# Patient Record
Sex: Female | Born: 1980 | Race: White | Hispanic: No | Marital: Married | Smoking: Former smoker
Health system: Southern US, Community
[De-identification: ages and names within clinical notes are randomized; demographics above are authoritative.]

## PROBLEM LIST (undated history)

## (undated) DIAGNOSIS — R519 Headache, unspecified: Secondary | ICD-10-CM

## (undated) DIAGNOSIS — Z8489 Family history of other specified conditions: Secondary | ICD-10-CM

## (undated) DIAGNOSIS — I340 Nonrheumatic mitral (valve) insufficiency: Secondary | ICD-10-CM

## (undated) DIAGNOSIS — F4310 Post-traumatic stress disorder, unspecified: Secondary | ICD-10-CM

## (undated) DIAGNOSIS — J302 Other seasonal allergic rhinitis: Secondary | ICD-10-CM

## (undated) HISTORY — PX: WISDOM TOOTH EXTRACTION: SHX21

## (undated) HISTORY — DX: Post-traumatic stress disorder, unspecified: F43.10

## (undated) HISTORY — PX: COLONOSCOPY: SHX174

## (undated) HISTORY — DX: Headache, unspecified: R51.9

---

## 2006-04-20 HISTORY — PX: DIAGNOSTIC LAPAROSCOPY: SUR761

## 2017-12-03 NOTE — Patient Instructions (Addendum)
Your procedure is scheduled on:  Wednesday, Jan 23  [facility] at: 7:15 am  Pick up the phone at the desk and dial [phone].  Call this number if you have problems the morning of surgery: [phone].  Remember: Do NOT eat or Do NOT drink clear liquids (including water) after midnight Tuesday  Take these medicines the morning of surgery with a SIP OF WATER:  None  Stop herbal medications and supplements 1 week prior to surgery.  Do NOT wear jewelry (body piercing), metal hair clips/bobby pins, make-up, or nail polish. Do NOT wear lotions, powders, or perfumes.  You may wear deoderant. Do NOT shave for 48 hours prior to surgery. Do NOT bring valuables to the hospital.  Leave suitcase in car.  After surgery it may be brought to your room.  For patients admitted to the hospital, checkout time is 11:00 AM the day of discharge. Have a responsible adult drive you home and stay with you for 24 hours after your procedure.  Home with husband Tina Kramer cell [phone].

## 2017-12-07 ENCOUNTER — Other Ambulatory Visit: Payer: Self-pay

## 2017-12-07 ENCOUNTER — Encounter (HOSPITAL_COMMUNITY): Payer: Self-pay | Admitting: *Deleted

## 2017-12-07 ENCOUNTER — Encounter (HOSPITAL_COMMUNITY)
Admission: RE | Admit: 2017-12-07 | Discharge: 2017-12-07 | Disposition: A | Discharge status: Home / Self Care | Payer: 59 | Source: Ambulatory Visit | Attending: Obstetrics and Gynecology | Admitting: Obstetrics and Gynecology

## 2017-12-07 DIAGNOSIS — Z01812 Encounter for preprocedural laboratory examination: Secondary | ICD-10-CM | POA: Diagnosis not present

## 2017-12-07 HISTORY — DX: Other seasonal allergic rhinitis: J30.2

## 2017-12-07 HISTORY — DX: Nonrheumatic mitral (valve) insufficiency: I34.0

## 2017-12-07 LAB — CBC
HEMATOCRIT: 40.2 % (ref 36.0–46.0)
HEMOGLOBIN: 13.3 g/dL (ref 12.0–15.0)
MCH: 32 pg (ref 26.0–34.0)
MCHC: 33.1 g/dL (ref 30.0–36.0)
MCV: 96.6 fL (ref 78.0–100.0)
Platelets: 252 10*3/uL (ref 150–400)
RBC: 4.16 MIL/uL (ref 3.87–5.11)
RDW: 12.3 % (ref 11.5–15.5)
WBC: 3.4 10*3/uL — AB (ref 4.0–10.5)

## 2017-12-11 NOTE — H&P (Signed)
Tina Kramer is an 37 y.o. female. Began with irregular menses last January, with cycles beginning every 18-24 days, moderate cramps, burning pain, heavy X 2 days-using "alot of tampons", up at night several times to change, Flow lasts 7 days. Hx of endometriosis with Surgery 02/2006-endometriosis seen behind the uterus per her report. She was unable to take 5 different OCPs to control cycles because of elevated BP and "made me mentally irrational".  Also did not do well with Mirena.  She is ready for definitive surgical therapy.  Pertinent Gynecological History: Last pap: normal Date: 2017 OB History: G1, P1001 SVD   Menstrual History: Patient's last menstrual period was 11/28/2017 (exact date).    Past Medical History:  Diagnosis Date  . Mitral valve regurgitation   . Seasonal allergies   . SVD (spontaneous vaginal delivery)    x 1    Past Surgical History:  Procedure Laterality Date  . COLONOSCOPY    . DIAGNOSTIC LAPAROSCOPY  04/2006   endometriosis  . WISDOM TOOTH EXTRACTION      No family history on file.  Social History:  reports that she quit smoking about 12 years ago. Her smoking use included cigarettes. She quit after 5.00 years of use. she has never used smokeless tobacco. She reports that she does not use drugs. Her alcohol history is not on file.  Allergies:  Allergies  Allergen Reactions  . Sulfa Antibiotics Hives    No medications prior to admission.    Review of Systems  Respiratory: Negative.   Cardiovascular: Negative.     Last menstrual period 11/28/2017. Physical Exam  Constitutional: She appears well-developed and well-nourished.  Cardiovascular: Normal rate, regular rhythm and normal heart sounds.  No murmur heard. Respiratory: Effort normal and breath sounds normal. No respiratory distress. She has no wheezes.  GI: Soft. She exhibits no distension and no mass. There is no tenderness.  Genitourinary: Vagina normal.  Genitourinary  Comments: Uterus normal size, slightly tender No adnexal mass    No results found for this or any previous visit (from the past 24 hour(s)).  No results found.  Assessment/Plan: Menorrhagia and dysmenorrhea, history of endometriosis.  She has not tolerated OCP or Mirena in the past.  All medical and surgical options discussed, she is ready for definitive surgical therapy.  Discussed surgical options and risks, chances of relieving her symptoms.  Will admit for open laparoscopy with LSH/bilateral salpingectomy in a bag, ERAS protocol to reduce pain. Wants to leave cervix for quicker recovery.  Leighton Roachodd D Colbey Wirtanen 12/11/2017, 4:35 PM

## 2017-12-12 ENCOUNTER — Ambulatory Visit (HOSPITAL_COMMUNITY)
Admission: RE | Admit: 2017-12-12 | Discharge: 2017-12-12 | Disposition: A | Discharge status: Home / Self Care | Payer: 59 | Source: Ambulatory Visit | Attending: Obstetrics and Gynecology | Admitting: Obstetrics and Gynecology

## 2017-12-12 ENCOUNTER — Encounter (HOSPITAL_COMMUNITY)
Admission: RE | Disposition: A | Discharge status: Home / Self Care | Payer: Self-pay | Source: Ambulatory Visit | Attending: Obstetrics and Gynecology

## 2017-12-12 ENCOUNTER — Ambulatory Visit (HOSPITAL_COMMUNITY): Payer: 59 | Admitting: Anesthesiology

## 2017-12-12 ENCOUNTER — Encounter (HOSPITAL_COMMUNITY): Payer: Self-pay | Admitting: Emergency Medicine

## 2017-12-12 ENCOUNTER — Other Ambulatory Visit: Payer: Self-pay

## 2017-12-12 DIAGNOSIS — I340 Nonrheumatic mitral (valve) insufficiency: Secondary | ICD-10-CM | POA: Diagnosis not present

## 2017-12-12 DIAGNOSIS — N920 Excessive and frequent menstruation with regular cycle: Secondary | ICD-10-CM | POA: Insufficient documentation

## 2017-12-12 DIAGNOSIS — Z87891 Personal history of nicotine dependence: Secondary | ICD-10-CM | POA: Insufficient documentation

## 2017-12-12 DIAGNOSIS — Z90711 Acquired absence of uterus with remaining cervical stump: Secondary | ICD-10-CM | POA: Diagnosis present

## 2017-12-12 DIAGNOSIS — N946 Dysmenorrhea, unspecified: Secondary | ICD-10-CM | POA: Diagnosis not present

## 2017-12-12 DIAGNOSIS — N800 Endometriosis of uterus: Secondary | ICD-10-CM | POA: Insufficient documentation

## 2017-12-12 HISTORY — DX: Family history of other specified conditions: Z84.89

## 2017-12-12 HISTORY — PX: LAPAROSCOPIC SUPRACERVICAL HYSTERECTOMY: SHX5399

## 2017-12-12 HISTORY — PX: BILATERAL SALPINGECTOMY: SHX5743

## 2017-12-12 LAB — PREGNANCY, URINE: PREG TEST UR: NEGATIVE

## 2017-12-12 SURGERY — HYSTERECTOMY, SUPRACERVICAL, LAPAROSCOPIC
Anesthesia: General

## 2017-12-12 MED ORDER — GABAPENTIN 300 MG PO CAPS
ORAL_CAPSULE | ORAL | Status: AC
  Administered 2017-12-12: 300 mg via ORAL
  Filled 2017-12-12: qty 1

## 2017-12-12 MED ORDER — SUGAMMADEX SODIUM 200 MG/2ML IV SOLN
INTRAVENOUS | Status: AC
  Filled 2017-12-12: qty 2

## 2017-12-12 MED ORDER — KETOROLAC TROMETHAMINE 30 MG/ML IJ SOLN: 30.0000 mg | Freq: Four times a day (QID) | INTRAMUSCULAR | Status: DC

## 2017-12-12 MED ORDER — OXYCODONE HCL 5 MG PO TABS: 5.0000 mg | ORAL_TABLET | ORAL | 0 refills | Status: DC | PRN

## 2017-12-12 MED ORDER — SODIUM CHLORIDE 0.9 % IJ SOLN
INTRAMUSCULAR | Status: AC
  Filled 2017-12-12: qty 10

## 2017-12-12 MED ORDER — DEXAMETHASONE SODIUM PHOSPHATE 4 MG/ML IJ SOLN
INTRAMUSCULAR | Status: DC | PRN
  Administered 2017-12-12: 4 mg via INTRAVENOUS

## 2017-12-12 MED ORDER — SCOPOLAMINE 1 MG/3DAYS TD PT72
MEDICATED_PATCH | TRANSDERMAL | Status: AC
  Administered 2017-12-12: 1.5 mg via TRANSDERMAL
  Filled 2017-12-12: qty 1

## 2017-12-12 MED ORDER — METOCLOPRAMIDE HCL 5 MG/ML IJ SOLN: 10.0000 mg | Freq: Once | INTRAMUSCULAR | Status: DC | PRN

## 2017-12-12 MED ORDER — PROPOFOL 10 MG/ML IV BOLUS
INTRAVENOUS | Status: DC | PRN
  Administered 2017-12-12: 200 mg via INTRAVENOUS

## 2017-12-12 MED ORDER — LACTATED RINGERS IV SOLN: INTRAVENOUS | Status: DC

## 2017-12-12 MED ORDER — KETOROLAC TROMETHAMINE 30 MG/ML IJ SOLN
30.0000 mg | Freq: Four times a day (QID) | INTRAMUSCULAR | Status: DC
  Administered 2017-12-12: 30 mg via INTRAVENOUS
  Filled 2017-12-12: qty 1

## 2017-12-12 MED ORDER — SOD CITRATE-CITRIC ACID 500-334 MG/5ML PO SOLN
30.0000 mL | Freq: Once | ORAL | Status: AC
  Administered 2017-12-12: 30 mL via ORAL

## 2017-12-12 MED ORDER — DEXAMETHASONE SODIUM PHOSPHATE 10 MG/ML IJ SOLN
INTRAMUSCULAR | Status: AC
  Filled 2017-12-12: qty 1

## 2017-12-12 MED ORDER — ROCURONIUM BROMIDE 100 MG/10ML IV SOLN
INTRAVENOUS | Status: DC | PRN
  Administered 2017-12-12: 10 mg via INTRAVENOUS
  Administered 2017-12-12: 50 mg via INTRAVENOUS

## 2017-12-12 MED ORDER — SUGAMMADEX SODIUM 200 MG/2ML IV SOLN
INTRAVENOUS | Status: DC | PRN
  Administered 2017-12-12: 100 mg via INTRAVENOUS

## 2017-12-12 MED ORDER — ONDANSETRON HCL 4 MG PO TABS: 4.0000 mg | ORAL_TABLET | Freq: Four times a day (QID) | ORAL | Status: DC | PRN

## 2017-12-12 MED ORDER — IBUPROFEN 600 MG PO TABS: 600.0000 mg | ORAL_TABLET | Freq: Four times a day (QID) | ORAL | Status: DC | PRN

## 2017-12-12 MED ORDER — ALUM & MAG HYDROXIDE-SIMETH 200-200-20 MG/5ML PO SUSP: 30.0000 mL | ORAL | Status: DC | PRN

## 2017-12-12 MED ORDER — SOD CITRATE-CITRIC ACID 500-334 MG/5ML PO SOLN
ORAL | Status: AC
  Filled 2017-12-12: qty 15

## 2017-12-12 MED ORDER — HYDROMORPHONE HCL 1 MG/ML IJ SOLN
0.2500 mg | INTRAMUSCULAR | Status: DC | PRN
  Administered 2017-12-12 (×2): 0.25 mg via INTRAVENOUS

## 2017-12-12 MED ORDER — ONDANSETRON HCL 4 MG/2ML IJ SOLN: 4.0000 mg | Freq: Four times a day (QID) | INTRAMUSCULAR | Status: DC | PRN

## 2017-12-12 MED ORDER — SCOPOLAMINE 1 MG/3DAYS TD PT72
1.0000 | MEDICATED_PATCH | Freq: Once | TRANSDERMAL | Status: DC
  Administered 2017-12-12: 1.5 mg via TRANSDERMAL

## 2017-12-12 MED ORDER — HYDROMORPHONE HCL 1 MG/ML IJ SOLN: 1.0000 mg | INTRAMUSCULAR | Status: DC | PRN

## 2017-12-12 MED ORDER — ACETAMINOPHEN 500 MG PO TABS
1000.0000 mg | ORAL_TABLET | ORAL | Status: AC
  Administered 2017-12-12: 1000 mg via ORAL

## 2017-12-12 MED ORDER — BUPIVACAINE HCL (PF) 0.25 % IJ SOLN
INTRAMUSCULAR | Status: AC
  Filled 2017-12-12: qty 30

## 2017-12-12 MED ORDER — ONDANSETRON HCL 4 MG/2ML IJ SOLN
INTRAMUSCULAR | Status: DC | PRN
  Administered 2017-12-12: 4 mg via INTRAVENOUS

## 2017-12-12 MED ORDER — SIMETHICONE 80 MG PO CHEW
80.0000 mg | CHEWABLE_TABLET | Freq: Four times a day (QID) | ORAL | Status: DC | PRN
Start: 2017-12-12 — End: 2017-12-12
  Administered 2017-12-12: 80 mg via ORAL
  Filled 2017-12-12: qty 1

## 2017-12-12 MED ORDER — OXYCODONE HCL 5 MG PO TABS
5.0000 mg | ORAL_TABLET | ORAL | Status: DC | PRN
  Administered 2017-12-12: 5 mg via ORAL
  Filled 2017-12-12: qty 1

## 2017-12-12 MED ORDER — MEPERIDINE HCL 25 MG/ML IJ SOLN: 6.2500 mg | INTRAMUSCULAR | Status: DC | PRN

## 2017-12-12 MED ORDER — IBUPROFEN 600 MG PO TABS: 600.0000 mg | ORAL_TABLET | Freq: Four times a day (QID) | ORAL | 0 refills | Status: DC | PRN

## 2017-12-12 MED ORDER — PROPOFOL 10 MG/ML IV BOLUS
INTRAVENOUS | Status: AC
  Filled 2017-12-12: qty 20

## 2017-12-12 MED ORDER — LACTATED RINGERS IV SOLN
INTRAVENOUS | Status: DC
  Administered 2017-12-12 (×2): via INTRAVENOUS

## 2017-12-12 MED ORDER — CEFAZOLIN SODIUM-DEXTROSE 2-4 GM/100ML-% IV SOLN
2.0000 g | INTRAVENOUS | Status: AC
  Administered 2017-12-12: 2 g via INTRAVENOUS

## 2017-12-12 MED ORDER — LIDOCAINE HCL (CARDIAC) 20 MG/ML IV SOLN
INTRAVENOUS | Status: AC
  Filled 2017-12-12: qty 5

## 2017-12-12 MED ORDER — HYDROMORPHONE HCL 1 MG/ML IJ SOLN
INTRAMUSCULAR | Status: AC
  Administered 2017-12-12: 0.25 mg via INTRAVENOUS
  Filled 2017-12-12: qty 0.5

## 2017-12-12 MED ORDER — ONDANSETRON HCL 4 MG/2ML IJ SOLN
INTRAMUSCULAR | Status: AC
  Filled 2017-12-12: qty 2

## 2017-12-12 MED ORDER — KETOROLAC TROMETHAMINE 30 MG/ML IJ SOLN
INTRAMUSCULAR | Status: DC | PRN
  Administered 2017-12-12: 30 mg via INTRAVENOUS

## 2017-12-12 MED ORDER — DEXTROSE-NACL 5-0.45 % IV SOLN
INTRAVENOUS | Status: DC
  Administered 2017-12-12: 12:00:00 via INTRAVENOUS

## 2017-12-12 MED ORDER — MIDAZOLAM HCL 2 MG/2ML IJ SOLN
INTRAMUSCULAR | Status: AC
  Filled 2017-12-12: qty 2

## 2017-12-12 MED ORDER — FENTANYL CITRATE (PF) 100 MCG/2ML IJ SOLN
INTRAMUSCULAR | Status: DC | PRN
  Administered 2017-12-12: 50 ug via INTRAVENOUS
  Administered 2017-12-12: 100 ug via INTRAVENOUS

## 2017-12-12 MED ORDER — FENTANYL CITRATE (PF) 250 MCG/5ML IJ SOLN
INTRAMUSCULAR | Status: AC
  Filled 2017-12-12: qty 5

## 2017-12-12 MED ORDER — GABAPENTIN 300 MG PO CAPS
300.0000 mg | ORAL_CAPSULE | ORAL | Status: AC
  Administered 2017-12-12: 300 mg via ORAL

## 2017-12-12 MED ORDER — SCOPOLAMINE 1 MG/3DAYS TD PT72: 1.0000 | MEDICATED_PATCH | TRANSDERMAL | Status: DC

## 2017-12-12 MED ORDER — MENTHOL 3 MG MT LOZG: 1.0000 | LOZENGE | OROMUCOSAL | Status: DC | PRN

## 2017-12-12 MED ORDER — GABAPENTIN 300 MG PO CAPS
300.0000 mg | ORAL_CAPSULE | Freq: Three times a day (TID) | ORAL | Status: DC
  Administered 2017-12-12: 300 mg via ORAL
  Filled 2017-12-12 (×3): qty 1

## 2017-12-12 MED ORDER — GABAPENTIN 300 MG PO CAPS: 300.0000 mg | ORAL_CAPSULE | Freq: Three times a day (TID) | ORAL | 0 refills | Status: DC

## 2017-12-12 MED ORDER — MIDAZOLAM HCL 2 MG/2ML IJ SOLN
INTRAMUSCULAR | Status: DC | PRN
  Administered 2017-12-12: 2 mg via INTRAVENOUS

## 2017-12-12 MED ORDER — LIDOCAINE HCL (CARDIAC) 20 MG/ML IV SOLN
INTRAVENOUS | Status: DC | PRN
  Administered 2017-12-12: 50 mg via INTRAVENOUS

## 2017-12-12 MED ORDER — ROCURONIUM BROMIDE 100 MG/10ML IV SOLN
INTRAVENOUS | Status: AC
  Filled 2017-12-12: qty 1

## 2017-12-12 MED ORDER — BUPIVACAINE HCL (PF) 0.25 % IJ SOLN
INTRAMUSCULAR | Status: DC | PRN
  Administered 2017-12-12: 20 mL

## 2017-12-12 SURGICAL SUPPLY — 32 items
BARRIER ADHS 3X4 INTERCEED (GAUZE/BANDAGES/DRESSINGS) ×4 IMPLANT
BLADE LAP MORCELLATOR 15MMX9.5 (ELECTROSURGICAL)
BLADE LAP MORCELLATOR 15X9.5 (ELECTROSURGICAL) IMPLANT
CELL SAVER LIPIGURD (MISCELLANEOUS) ×2 IMPLANT
COVER MAYO STAND STRL (DRAPES) ×4 IMPLANT
DERMABOND ADHESIVE PROPEN (GAUZE/BANDAGES/DRESSINGS) ×2
DERMABOND ADVANCED .7 DNX6 (GAUZE/BANDAGES/DRESSINGS) ×2 IMPLANT
DRSG OPSITE POSTOP 3X4 (GAUZE/BANDAGES/DRESSINGS) ×4 IMPLANT
DURAPREP 26ML APPLICATOR (WOUND CARE) ×4 IMPLANT
EXTRT SYSTEM ALEXIS 14CM (MISCELLANEOUS) ×4
FILTER SMOKE EVAC LAPAROSHD (FILTER) ×4 IMPLANT
GLOVE BIO SURGEON STRL SZ8 (GLOVE) ×4 IMPLANT
GLOVE BIOGEL PI IND STRL 7.0 (GLOVE) ×4 IMPLANT
GLOVE BIOGEL PI INDICATOR 7.0 (GLOVE) ×4
GLOVE ORTHO TXT STRL SZ7.5 (GLOVE) ×4 IMPLANT
GOWN STRL REUS W/TWL LRG LVL3 (GOWN DISPOSABLE) ×8 IMPLANT
NEEDLE INSUFFLATION 120MM (ENDOMECHANICALS) ×4 IMPLANT
PACK LAPAROSCOPY BASIN (CUSTOM PROCEDURE TRAY) ×4 IMPLANT
PACK TRENDGUARD 450 HYBRID PRO (MISCELLANEOUS) ×2 IMPLANT
PROTECTOR NERVE ULNAR (MISCELLANEOUS) ×8 IMPLANT
SET IRRIG TUBING LAPAROSCOPIC (IRRIGATION / IRRIGATOR) ×4 IMPLANT
SHEARS HARMONIC ACE PLUS 36CM (ENDOMECHANICALS) ×4 IMPLANT
SLEEVE XCEL OPT CAN 5 100 (ENDOMECHANICALS) ×4 IMPLANT
SUT VIC AB 3-0 PS2 18 (SUTURE) ×2
SUT VIC AB 3-0 PS2 18XBRD (SUTURE) ×2 IMPLANT
SUT VICRYL 0 TIES 12 18 (SUTURE) ×4 IMPLANT
SUT VICRYL 0 UR6 27IN ABS (SUTURE) ×4 IMPLANT
TOWEL OR 17X24 6PK STRL BLUE (TOWEL DISPOSABLE) ×8 IMPLANT
TRAY FOLEY CATH SILVER 14FR (SET/KITS/TRAYS/PACK) ×4 IMPLANT
TRENDGUARD 450 HYBRID PRO PACK (MISCELLANEOUS) ×4
TROCAR XCEL NON-BLD 5MMX100MML (ENDOMECHANICALS) ×4 IMPLANT
WARMER LAPAROSCOPE (MISCELLANEOUS) ×4 IMPLANT

## 2017-12-12 NOTE — Progress Notes (Signed)
[facility] pain ok, ambulating, voiding, no n/v, wants to go home Afeb, VSS Abd- soft, incisions intact D/c home

## 2017-12-12 NOTE — Anesthesia Postprocedure Evaluation (Signed)
Anesthesia Post Note  Patient: Tina Kramer  Procedure(s) Performed: LAPAROSCOPIC SUPRACERVICAL HYSTERECTOMY (N/A ) BILATERAL SALPINGECTOMY (Bilateral )     Patient location during evaluation: PACU Anesthesia Type: General Level of consciousness: awake and alert and oriented Pain management: pain level controlled Vital Signs Assessment: post-procedure vital signs reviewed and stable Respiratory status: spontaneous breathing, nonlabored ventilation and respiratory function stable Cardiovascular status: blood pressure returned to baseline and stable Postop Assessment: no apparent nausea or vomiting Anesthetic complications: no    Last Vitals:  Vitals:   12/12/17 1115 12/12/17 1130  BP: 118/79 116/77  Pulse: (!) 41 (!) 41  Resp: 16 16  Temp:  36.8 C  SpO2: 100% 100%    Last Pain:  Vitals:   12/12/17 1130  TempSrc:   PainSc: 1    Pain Goal: Patients Stated Pain Goal: 4 (12/12/17 0718)               Rollins Wrightson A.

## 2017-12-12 NOTE — Interval H&P Note (Signed)
History and Physical Interval Note:  12/12/2017 8:06 AM  Tina Kramer  has presented today for surgery, with the diagnosis of menorrhagia, dysmenorrhea  The various methods of treatment have been discussed with the patient and family. After consideration of risks, benefits and other options for treatment, the patient has consented to  Procedure(s): LAPAROSCOPIC SUPRACERVICAL HYSTERECTOMY (N/A) BILATERAL SALPINGECTOMY (Bilateral) as a surgical intervention .  The patient's history has been reviewed, patient examined, no change in status, stable for surgery.  I have reviewed the patient's chart and labs.  Questions were answered to the patient's satisfaction.     Leighton Roachodd D Amethyst Gainer

## 2017-12-12 NOTE — Anesthesia Preprocedure Evaluation (Addendum)
Anesthesia Evaluation  Patient identified by MRN, date of birth, ID band Patient awake    Reviewed: Allergy & Precautions, NPO status , Patient's Chart, lab work & pertinent test results  History of Anesthesia Complications (+) Family history of anesthesia reaction  Airway Mallampati: I  TM Distance: >3 FB Neck ROM: Full    Dental no notable dental hx. (+) Teeth Intact Lower permanent retainer:   Pulmonary former smoker,    Pulmonary exam normal breath sounds clear to auscultation       Cardiovascular negative cardio ROS Normal cardiovascular exam Rhythm:Regular Rate:Normal     Neuro/Psych negative neurological ROS  negative psych ROS   GI/Hepatic negative GI ROS, Neg liver ROS,   Endo/Other  negative endocrine ROS  Renal/GU negative Renal ROS  negative genitourinary   Musculoskeletal negative musculoskeletal ROS (+)   Abdominal   Peds  Hematology negative hematology ROS (+)   Anesthesia Other Findings   Reproductive/Obstetrics Menorrhagia Dysmenorrhea                            Anesthesia Physical Anesthesia Plan  ASA: II  Anesthesia Plan: General   Post-op Pain Management:    Induction: Intravenous  PONV Risk Score and Plan: Ondansetron, Dexamethasone, Scopolamine patch - Pre-op, Midazolam and Treatment may vary due to age or medical condition  Airway Management Planned: Oral ETT  Additional Equipment:   Intra-op Plan:   Post-operative Plan: Extubation in OR  Informed Consent: I have reviewed the patients History and Physical, chart, labs and discussed the procedure including the risks, benefits and alternatives for the proposed anesthesia with the patient or authorized representative who has indicated his/her understanding and acceptance.   Dental advisory given  Plan Discussed with: CRNA, Anesthesiologist and Surgeon  Anesthesia Plan Comments:          Anesthesia Quick Evaluation

## 2017-12-12 NOTE — Transfer of Care (Signed)
Immediate Anesthesia Transfer of Care Note  Patient: Tina Kramer  Procedure(s) Performed: LAPAROSCOPIC SUPRACERVICAL HYSTERECTOMY (N/A ) BILATERAL SALPINGECTOMY (Bilateral )  Patient Location: PACU  Anesthesia Type:General  Level of Consciousness: awake, alert  and oriented  Airway & Oxygen Therapy: Patient Spontanous Breathing and Patient connected to nasal cannula oxygen  Post-op Assessment: Report given to RN and Post -op Vital signs reviewed and stable  Post vital signs: Reviewed and stable  Last Vitals:  Vitals:   12/12/17 0718  BP: 120/86  Pulse: (!) 58  Resp: 16  Temp: 36.6 C  SpO2: 100%    Last Pain:  Vitals:   12/12/17 0718  TempSrc: Oral      Patients Stated Pain Goal: 4 (12/12/17 0718)  Complications: No apparent anesthesia complications

## 2017-12-12 NOTE — Op Note (Signed)
Preoperative diagnosis: Menorrhagia, dysmenorrhea Postoperative diagnosis: Same Procedure: Laparoscopic supracervical hysterectomy, bilateral salpingectomy Surgeon: Lavina Hammanodd Arriel Victor M.D. Assistant: Doristine Counterecelia Banga, D.O. Anesthesia: Gen. Endotracheal tube Findings: She had an essentially normal pelvis  Specimens: Morcellated uterus for routine pathology Estimated blood loss: 100 cc Complications: None  Procedure in detail: The patient was taken to the operating room and placed in the dorsosupine position. General anesthesia was induced. Arms were tucked to her sides and legs were placed in mobile stirrups. Abdomen perineum and vagina were then prepped and draped in usual sterile fashion and a Foley catheter was inserted. Infraumbilical skin was infiltrated with quarter percent Marcaine and a 3 cm horizontal incision was made.  The fascia was elevated and entered sharply with scissors.  Peritoneum was then entered bluntly.  A pursestring suture of 0 Vicryl was then placed around the fascial incision.  The Hassan cannula with a balloon was then inserted and the abdomen was insufflated.  The laparoscope was inserted and confirmed good placement.  A 5 mm port was then placed on each side under direct visualization. Inspection revealed the above-mentioned findings.  The distal right fallopian tube was grasped and elevated.  Harmonic scalpel was used to remove the tube from the ovary and take down the mesosalpinx to the uterus.  The right uterine cornu was grasped with a single-tooth tenaculum from the left side. The Harmonic scalpel Ace was used to take down the right round ligament, utero-ovarian pedicle and broad ligament. The anterior peritoneum was incised across the anterior portion of the uterus to help release the bladder. Uterine artery artery was skeletonized and taken down with the harmonic scalpel Ace with adequate division and adequate hemostasis. A similar procedure was then performed on the patient's  left side taking down the mesosalpinx to free the tube, round ligament, utero-ovarian pedicle, and broad ligament. Anterior peritoneum was incised across the anterior portion the uterus to meet the incision coming from the patient's right side. Uterine artery was skeletonized and taken down with the Harmonic Scalpel with adequate division and adequate hemostasis. I then began to remove the uterus from the cervix using a drill, clamp, cut technique on maximum power, using the Harmonic scalpel Ace. This was done about halfway on the left side and then halfway on the right side removing the uterus from the cervix. The cervical stump appeared to be hemostatic.  The umbilical trocar was removed and an Alexis bag was inserted through the incision into the pelvis.  The umbilical trocar was reintroduced and the abdomen was reinsufflated.  I was then able to position the bag in the pelvis and placed the uterus in the bag.  The string for the bag was grasped through the umbilical trocar and pulled up through that trocar as it was again removed.  The bag was then brought through the incision so that we could morcellate the uterus.  I was unable to place the guard in the bag to protect the abdominal incision because the incision was so small.  However I carefully used a scalpel and was able to remove the uterus in several pieces in the bag.  The bag was removed and the umbilical trocar was replaced.  Pelvis was copiously irrigated. Small amount of bleeding from the cervical stump was controlled with the harmonic scalpel Ace. A piece of Interceed was placed over the cervical stump. At this point all pedicles appeared to be hemostatic and there was no other pathology noted.  The 5 mm ports were removed under direct  visualization all gas was allowed to deflate from the abdomen.  The umbilical trocar was removed.  The previously placed pursestring suture was tied and this achieved good fascial closure.  Skin incisions were closed  with interrupted subcuticular sutures of 4-0 Vicryl followed by Dermabond. The patient tolerated the procedure well. She was taken to the recovery in stable condition. Counts were correct x2, she received Ancef 2 g IV the beginning of the procedure and had PAS hose on throughout the procedure.

## 2017-12-12 NOTE — Progress Notes (Signed)
Discharge teaching complete with pt. Pt understood all information and did not have any questions. Pt given pain med at discharge. Pt discharged home to family.

## 2017-12-12 NOTE — Discharge Instructions (Signed)
Routine instructions for laparoscopic hysterectomy 

## 2017-12-12 NOTE — Anesthesia Procedure Notes (Signed)
Procedure Name: Intubation Date/Time: 12/12/2017 8:51 AM Performed by: Cleda ClarksBrowder, Iliya Spivack R, CRNA Pre-anesthesia Checklist: Patient identified, Emergency Drugs available, Suction available and Patient being monitored Patient Re-evaluated:Patient Re-evaluated prior to induction Oxygen Delivery Method: Circle system utilized Preoxygenation: Pre-oxygenation with 100% oxygen Induction Type: IV induction Ventilation: Mask ventilation without difficulty Laryngoscope Size: Miller and 2 Grade View: Grade I Tube type: Oral Number of attempts: 1 Airway Equipment and Method: Stylet and Oral airway Placement Confirmation: ETT inserted through vocal cords under direct vision,  positive ETCO2 and breath sounds checked- equal and bilateral Secured at: 22 cm Tube secured with: Tape Dental Injury: Teeth and Oropharynx as per pre-operative assessment

## 2017-12-13 ENCOUNTER — Encounter (HOSPITAL_COMMUNITY): Payer: Self-pay | Admitting: Obstetrics and Gynecology

## 2018-12-16 ENCOUNTER — Other Ambulatory Visit: Payer: Self-pay | Admitting: Gynecology

## 2018-12-16 DIAGNOSIS — R928 Other abnormal and inconclusive findings on diagnostic imaging of breast: Secondary | ICD-10-CM

## 2018-12-23 ENCOUNTER — Ambulatory Visit: Payer: 59

## 2018-12-23 ENCOUNTER — Other Ambulatory Visit: Payer: Self-pay | Admitting: Gynecology

## 2018-12-23 ENCOUNTER — Ambulatory Visit
Admission: RE | Admit: 2018-12-23 | Discharge: 2018-12-23 | Disposition: A | Discharge status: Home / Self Care | Payer: 59 | Source: Ambulatory Visit | Attending: Gynecology | Admitting: Gynecology

## 2018-12-23 DIAGNOSIS — N6001 Solitary cyst of right breast: Secondary | ICD-10-CM

## 2018-12-23 DIAGNOSIS — R928 Other abnormal and inconclusive findings on diagnostic imaging of breast: Secondary | ICD-10-CM

## 2019-06-24 ENCOUNTER — Ambulatory Visit
Admission: RE | Admit: 2019-06-24 | Discharge: 2019-06-24 | Disposition: A | Discharge status: Home / Self Care | Payer: 59 | Source: Ambulatory Visit | Attending: Gynecology | Admitting: Gynecology

## 2019-06-24 ENCOUNTER — Other Ambulatory Visit: Payer: Self-pay

## 2019-06-24 DIAGNOSIS — N6001 Solitary cyst of right breast: Secondary | ICD-10-CM

## 2021-07-21 DIAGNOSIS — G459 Transient cerebral ischemic attack, unspecified: Secondary | ICD-10-CM

## 2021-07-21 HISTORY — DX: Transient cerebral ischemic attack, unspecified: G45.9

## 2022-03-29 ENCOUNTER — Encounter: Payer: Self-pay | Admitting: *Deleted

## 2022-03-30 NOTE — Progress Notes (Signed)
? ?Referring:  ?Key, Verita SchneidersEvelyn M, NP ?[address] ?[address] ?[place] [id number] ? ?PCP: ?Josephina GipJarrett, Shauna, NP ? ?Neurology was asked to evaluate Tina Kramer, a 41 year old female for a chief complaint of headaches.  Our recommendations of care will be communicated by shared medical record.   ? ?CC:  headaches ? ?History provided from self ? 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 ?The patient presents for evaluation of headaches which began following her second COVID vaccine in 2021. After this she developed frequent visual auras described as a slowly spreading blind spot lasting 20-30 minutes at a time. She also developed sharp right sided headaches that feel "like an ice pick". Initially she was having migraines 4 times per week, but they have decreased in frequency over time. She currently has 1-2 per month. Takes ibuprofen as needed which makes the pain manageable but does not resolve the headache. ? ?In September 2022 she was going for a run when she suddenly felt very tired,  then noticed the left side of her face was numb and drooping. Her vision suddenly became very blurry on the left. Then she developed garbled speech. Stood up and felt very dizzy. These symptoms lasted for 10 minutes before they resolved. She did not have a headache that day.  [facility] She was diagnosed with a TIA. Did not undergo further stroke workup. ? ?Has a history of migraines in her 2320s but they had remitted for several years until recently. She did try nortriptyline and Maxalt previously but was unable to tolerate these due to side effects. ? ?Headache History: ?Onset: 2021 ?Triggers: none ?Aura: visual aura (slowly spreading blind spot lasting 30 minutes) ?Location: right retro-orbital ?Quality/Description: sharp, aching ?Associated Symptoms: ? Photophobia: no ? Phonophobia: yes ? Nausea: no ?Other symptoms: tinnitus, ipsilateral lacrimation ?Worse with  activity?: yes ?Duration of headaches: several hours ? ?Headache days per month: 2 ?Headache free days per month: 28 ? ?Current Treatment: ?Abortive ?ibuprofen ? ?Preventative ?none ? ?Prior Therapies                                 ?Rizatriptan 10 mg PRN ?Baclofen 10 mg TID PRN ?Nortriptyline 10 mg QHS - drowsiness ?Gabapentin 300 mg TID - drowsiness ?Maxalt - drowsiness ? ?LABS: ?CBC ?   ?Component Value Date/Time  ? WBC 3.4 (L) 12/07/2017 0935  ? RBC 4.16 12/07/2017 0935  ? HGB 13.3 12/07/2017 0935  ? HCT 40.2 12/07/2017 0935  ? PLT 252 12/07/2017 0935  ? MCV 96.6 12/07/2017 0935  ? MCH 32.0 12/07/2017 0935  ? MCHC 33.1 12/07/2017 0935  ? RDW 12.3 12/07/2017 0935  ? ?CBC 08/02/21: Unremarkable other than mildly low sodium (133) ? ?IMAGING:  ?CTH 08/02/21: unremarkable ? ? ?Current Outpatient Medications on File Prior to Visit  ?Medication Sig Dispense Refill  ? ibuprofen (ADVIL,MOTRIN) 200 MG tablet Take 200-400 mg by mouth every 6 (six) hours as needed for headache or moderate pain.    ? Multiple Vitamins-Minerals (WOMENS MULTIVITAMIN PO) Take 1 tablet by mouth 2 (two) times daily.    ? polyethylene glycol (MIRALAX / GLYCOLAX) 17 g packet Take 17 g by mouth daily.    ? ?No current facility-administered medications on file prior to visit.  ? ? ? ?Allergies: ?Allergies  ?Allergen Reactions  ? Prednisone Palpitations  ?  Increased rate  ?  Sulfa Antibiotics Hives  ? ? ?Family History: ?Migraine or other headaches in the family:  mom ?Aneurysms in a first degree relative:  distant relative with aneurysm ?Brain tumors in the family:  distant relative with brain cancer ?Other neurological illness in the family:   dad had TIA ? ?Past Medical History: ?Past Medical History:  ?Diagnosis Date  ? Family history of adverse reaction to anesthesia   ? cousin almost "died" from surgery one time  ? Mitral valve regurgitation   ? PTSD (post-traumatic stress disorder)   ? Seasonal allergies   ? SVD (spontaneous vaginal delivery)   ?  x 1  ? TIA (transient ischemic attack) 07/2021  ? ? ?Past Surgical History ?Past Surgical History:  ?Procedure Laterality Date  ? BILATERAL SALPINGECTOMY Bilateral 12/12/2017  ? Procedure: BILATERAL SALPINGECTOMY;  Surgeon: Lavina Hamman, MD;  Location: [facility];  Service: Gynecology;  Laterality: Bilateral;  ? COLONOSCOPY    ? DIAGNOSTIC LAPAROSCOPY  04/2006  ? endometriosis  ? LAPAROSCOPIC SUPRACERVICAL HYSTERECTOMY N/A 12/12/2017  ? Procedure: LAPAROSCOPIC SUPRACERVICAL HYSTERECTOMY;  Surgeon: Lavina Hamman, MD;  Location: [facility];  Service: Gynecology;  Laterality: N/A;  ? WISDOM TOOTH EXTRACTION    ? ? ?Social History: ?Social History  ? ?Tobacco Use  ? Smoking status: Former  ?  Years: 5.00  ?  Types: Cigarettes  ?  Quit date: 11/20/2005  ?  Years since quitting: 16.3  ? Smokeless tobacco: Never  ?Vaping Use  ? Vaping Use: Never used  ?Substance Use Topics  ? Alcohol use: Not Currently  ?  Comment: wine ocasional  ? Drug use: No  ? ? ?ROS: ?Negative for fevers, chills. Positive for headaches, vision changes. All other systems reviewed and negative unless stated otherwise in HPI. ? ? ?Physical Exam:  ? ?Vital Signs: ?BP 117/75   Pulse 62   Ht 5' 3.5" (1.613 m)   Wt 115 lb 6.4 oz (52.3 kg)   LMP 11/28/2017 (Exact Date)   BMI 20.12 kg/m?  ?GENERAL: well appearing,in no acute distress,alert ?SKIN:  Color, texture, turgor normal. No rashes or lesions ?HEAD:  Normocephalic/atraumatic. ?CV:  RRR ?RESP: Normal respiratory effort ?MSK: no tenderness to palpation over occiput, neck, or shoulders ? ?NEUROLOGICAL: ?Mental Status: Alert, oriented to person, place and time,Follows commands ?Cranial Nerves: PERRL, visual fields intact to confrontation, extraocular movements intact, facial sensation intact, no facial droop or ptosis, hearing grossly intact, no dysarthria ?Motor: muscle strength 5/5 both upper and lower extremities ?Reflexes: 2+ throughout ?Sensation: intact to light touch all 4 extremities ?Coordination:  Finger-to- nose-finger intact bilaterally,Heel-to-shin intact bilaterally ?Gait: normal-based ? ? 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 Her visual symptoms are consistent with migraine aura. Her episode in September 2022 is concerning for possible TIA. Discussed how migraine aura can sometimes present with similar symptoms, however sudden onset of symptoms and lack of associated headache do raise concern for TIA. Will plan for stroke workup to assess for underlying vascular risk factors. She will start ASA 81 daily for stroke prevention. Triptans are contraindicated in patients with history of TIA. Will start Nurtec for migraine rescue. Provided supplement information for migraine prevention (magnesium, B2, CoQ10). ? ?PLAN: ? ?Possible TIA: ?-MRI brain ?-CTA head/neck ?-TTE ?-Blood work: lipid panel, A1c ?-Start ASA 81 mg daily ? ?Migraine with aura: ?-Start Nurtec 75 mg PRN ?-Supplement information provided (Mg, B2, CoQ10) ? ? ?I spent a  total of 41 minutes chart reviewing and counseling the patient. Headache education was done. Discussed treatment options including acute medications and natural supplements. Discussed medication side effects, adverse reactions and drug interactions. Written educational materials and patient instructions outlining all of the above were given. ? ?Follow-up: 4 months ? ? ?Ocie Doyne, MD ?03/31/2022   ?12:04 PM ? ? ?

## 2022-03-31 ENCOUNTER — Encounter: Payer: Self-pay | Admitting: Psychiatry

## 2022-03-31 ENCOUNTER — Telehealth: Payer: Self-pay | Admitting: Psychiatry

## 2022-03-31 ENCOUNTER — Ambulatory Visit (INDEPENDENT_AMBULATORY_CARE_PROVIDER_SITE_OTHER): Payer: 59 | Admitting: Psychiatry

## 2022-03-31 VITALS — BP 117/75 | HR 62 | Ht 63.5 in | Wt 115.4 lb

## 2022-03-31 DIAGNOSIS — G459 Transient cerebral ischemic attack, unspecified: Secondary | ICD-10-CM | POA: Diagnosis not present

## 2022-03-31 DIAGNOSIS — G43109 Migraine with aura, not intractable, without status migrainosus: Secondary | ICD-10-CM | POA: Diagnosis not present

## 2022-03-31 MED ORDER — NURTEC 75 MG PO TBDP: 75.0000 mg | ORAL_TABLET | ORAL | 3 refills | Status: AC | PRN

## 2022-03-31 NOTE — Telephone Encounter (Signed)
All 3 scans sent to GI they obtain [name] and call patient to schedule. ?

## 2022-03-31 NOTE — Patient Instructions (Addendum)
MRI brain ?CTA of the head and neck ?Heart ultrasound ?Blood work - A1c, Lipid panel ?Start aspirin 81 mg daily ?Take Nurtec as needed for migraine ? ? ?Natural supplements that can reduce migraines: ?Magnesium Oxide or Magnesium Glycinate 500 mg at bed (up to 800 mg daily) ?Coenzyme Q10 300 mg in AM ?Vitamin B2- 200 mg twice a day ? ?Add 1 supplement at a time since even natural supplements can have undesirable side effects. You can sometimes buy supplements cheaper (especially Coenzyme Q10) at www.[employer].co.za or at [employer]. ? ?Magnesium: Magnesium (250 mg twice a day or 500 mg at bed) has a relaxant effect on smooth muscles such as blood vessels. Individuals suffering from frequent or daily headache usually have low magnesium levels which can be increase with daily supplementation of 400-800 mg. Three trials found 40-90% average headache reduction  when used as a preventative. Magnesium also demonstrated the benefit in menstrually related migraine.  Magnesium is part of the messenger system in the serotonin cascade and it is a good muscle relaxant.  It is also useful for constipation. Good sources include nuts, whole grains, and tomatoes. Side Effects: loose stool/diarrhea ? ?Riboflavin (vitamin B 2) 200 mg twice a day. This vitamin assists nerve cells in the production of ATP a principal energy storing molecule.  It is necessary for many chemical reactions in the body.  There have been at least 3 clinical trials of riboflavin using 400 mg per day all of which suggested that migraine frequency can be decreased.  All 3 trials showed significant improvement in over half of migraine sufferers.  The supplement is found in bread, cereal, milk, meat, and poultry.  Most Americans get more riboflavin than the recommended daily allowance, however riboflavin deficiency is not necessary for the supplements to help prevent headache. Side effects: energizing, green urine ? ?Coenzyme Q10: This is present in almost all cells in  the body and is critical component for the conversion of energy.  Recent studies have shown that a nutritional supplement of CoQ10 can reduce the frequency of migraine attacks by improving the energy production of cells as with riboflavin.  Doses of 150 mg twice a day have been shown to be effective. ? ? ?

## 2022-04-01 LAB — LIPID PANEL
Chol/HDL Ratio: 2.6 ratio (ref 0.0–4.4)
Cholesterol, Total: 204 mg/dL — ABNORMAL HIGH (ref 100–199)
HDL: 78 mg/dL (ref >39)
LDL Chol Calc (NIH): 117 mg/dL — ABNORMAL HIGH (ref 0–99)
Triglycerides: 50 mg/dL (ref 0–149)
VLDL Cholesterol Cal: 9 mg/dL (ref 5–40)

## 2022-04-01 LAB — HEMOGLOBIN A1C
Est. average glucose Bld gHb Est-mCnc: 105 mg/dL
Hgb A1c MFr Bld: 5.3 % (ref 4.8–5.6)

## 2022-04-03 ENCOUNTER — Encounter: Payer: Self-pay | Admitting: *Deleted

## 2022-04-03 ENCOUNTER — Telehealth: Payer: Self-pay | Admitting: *Deleted

## 2022-04-03 ENCOUNTER — Other Ambulatory Visit: Payer: Self-pay | Admitting: Psychiatry

## 2022-04-03 ENCOUNTER — Encounter: Payer: Self-pay | Admitting: Psychiatry

## 2022-04-03 MED ORDER — ATORVASTATIN CALCIUM 40 MG PO TABS: 40.0000 mg | ORAL_TABLET | Freq: Every day | ORAL | 3 refills | Status: AC

## 2022-04-03 NOTE — Telephone Encounter (Signed)
Nurtec PA, Key: [id number] .  ?approved from 04/03/2022 to 04/03/2023. Faxed approval to pharmacy. ?

## 2022-04-14 ENCOUNTER — Telehealth: Payer: Self-pay | Admitting: Psychiatry

## 2022-04-14 NOTE — Telephone Encounter (Signed)
Tina Kramer NPR ref: [id number] sent to Lupita Leash at [facility] for scheduling

## 2022-04-15 ENCOUNTER — Ambulatory Visit
Admission: RE | Admit: 2022-04-15 | Discharge: 2022-04-15 | Disposition: A | Discharge status: Home / Self Care | Payer: 59 | Source: Ambulatory Visit | Attending: Psychiatry | Admitting: Psychiatry

## 2022-04-15 DIAGNOSIS — G459 Transient cerebral ischemic attack, unspecified: Secondary | ICD-10-CM

## 2022-05-01 ENCOUNTER — Ambulatory Visit
Admission: RE | Admit: 2022-05-01 | Discharge: 2022-05-01 | Disposition: A | Discharge status: Home / Self Care | Payer: 59 | Source: Ambulatory Visit | Attending: Psychiatry | Admitting: Psychiatry

## 2022-05-01 DIAGNOSIS — G459 Transient cerebral ischemic attack, unspecified: Secondary | ICD-10-CM

## 2022-05-01 MED ORDER — IOPAMIDOL (ISOVUE-370) INJECTION 76%
75.0000 mL | Freq: Once | INTRAVENOUS | Status: AC | PRN
  Administered 2022-05-01: 75 mL via INTRAVENOUS

## 2022-05-02 ENCOUNTER — Ambulatory Visit (HOSPITAL_COMMUNITY)
Admission: RE | Admit: 2022-05-02 | Discharge: 2022-05-02 | Disposition: A | Discharge status: Home / Self Care | Payer: 59 | Source: Ambulatory Visit | Attending: Psychiatry | Admitting: Psychiatry

## 2022-05-02 ENCOUNTER — Encounter: Payer: Self-pay | Admitting: Psychiatry

## 2022-05-02 DIAGNOSIS — G43909 Migraine, unspecified, not intractable, without status migrainosus: Secondary | ICD-10-CM | POA: Insufficient documentation

## 2022-05-02 DIAGNOSIS — G459 Transient cerebral ischemic attack, unspecified: Secondary | ICD-10-CM | POA: Insufficient documentation

## 2022-05-02 LAB — ECHOCARDIOGRAM COMPLETE BUBBLE STUDY
Area-P 1/2: 3.1 cm2
S' Lateral: 2.7 cm

## 2022-08-21 ENCOUNTER — Ambulatory Visit (INDEPENDENT_AMBULATORY_CARE_PROVIDER_SITE_OTHER): Payer: 59 | Admitting: Psychiatry

## 2022-08-21 ENCOUNTER — Encounter: Payer: Self-pay | Admitting: Psychiatry

## 2022-08-21 VITALS — BP 115/74 | HR 54 | Ht 63.5 in | Wt 114.4 lb

## 2022-08-21 DIAGNOSIS — G43109 Migraine with aura, not intractable, without status migrainosus: Secondary | ICD-10-CM

## 2022-08-21 MED ORDER — KETOROLAC TROMETHAMINE 60 MG/2ML IM SOLN
60.0000 mg | Freq: Once | INTRAMUSCULAR | Status: AC
  Administered 2022-08-21: 60 mg via INTRAMUSCULAR

## 2022-08-21 NOTE — Progress Notes (Signed)
Orders given from MD for Ketorolac 60mg    Pt verified by name and DOB   Explain procedure to pt,   ketorolac 60mg  given in the RD   Pt advised to wait 15 min's after injection    Pt tolerated well

## 2022-08-21 NOTE — Progress Notes (Signed)
   CC:  headaches  Follow-up Visit  Last visit: 03/31/22  Brief HPI: 41 year old female with a history of Bell's Palsy and suspected TIA who follows in clinic for migraine with aura.  At her last visit, TIA workup was ordered. She was started on ASA 81 mg daily for stroke prevention. Nurtec was started for migraine rescue.  Interval History: Brain MRI 04/15/22 showed nonspecific white matter changes and was otherwise unremarkable. CTA head/neck was normal.  TTE showed a PFO and she was recommended to continue ASA 81 mg daily. A1c was 5.3. LDL was 117. She wanted to try diet and lifestyle changes to see if she could lower this naturally before starting a statin. She has blood work including updated lipid panel scheduled for later this week.  She started taking magnesium which has improved her migraines. Has only taken Nurtec 1-2 times and it did work well for her. She has had a nagging headache for the past week, which she suspects is hormonal.  Headache days per month: 4 Headache free days per month: 26  Current Headache Regimen: Preventative: none Abortive: Nurtec 75 mg PRN  Prior Therapies                                  Rizatriptan 10 mg PRN Baclofen 10 mg TID PRN Nortriptyline 10 mg QHS - drowsiness Gabapentin 300 mg TID - drowsiness Maxalt - drowsiness Nurtec 75 mg PRN  Physical Exam:   Vital Signs: BP 115/74   Pulse (!) 54   Ht 5' 3.5" (1.613 m)   Wt 114 lb 6.4 oz (51.9 kg)   LMP 11/28/2017 (Exact Date)   BMI 19.95 kg/m  GENERAL:  well appearing, in no acute distress, alert  SKIN:  Color, texture, turgor normal. No rashes or lesions HEAD:  Normocephalic/atraumatic. RESP: normal respiratory effort MSK:  No gross joint deformities.   NEUROLOGICAL: Mental Status: Alert, oriented to person, place and time, Follows commands, and Speech fluent and appropriate. Cranial Nerves: PERRL, face symmetric, no dysarthria, hearing grossly intact Motor: moves all extremities  equally Gait: normal-based.  IMPRESSION: 41 year old female with a history of Bell's Palsy and suspected TIA who presents for follow up of migraine with aura. Migraines have improved since starting magnesium for prevention. Nurtec works well for rescue. Will continue current headache regimen. She has a repeat lipid panel scheduled for later this week. Will plan to start a statin if LDL is still >70.  PLAN: -Continue daily magnesium for migraine prevention -Nurtec 75 mg PRN for migraine rescue -Continue ASA 81 mg daily -Repeat lipid panel scheduled for later this week, consider statin if LDL >70  Follow-up: 1 year, or sooner if needed  I spent a total of 27 minutes on the date of the service. Headache education was done. Discussed medication side effects, adverse reactions and drug interactions. Written educational materials and patient instructions outlining all of the above were given.  Genia Harold, MD 08/21/22 2:31 PM

## 2023-04-04 ENCOUNTER — Other Ambulatory Visit (HOSPITAL_COMMUNITY): Payer: Self-pay

## 2023-04-04 ENCOUNTER — Telehealth: Payer: Self-pay | Admitting: Pharmacy Technician

## 2023-04-04 NOTE — Telephone Encounter (Signed)
Patient Advocate Encounter  Prior Authorization for NURTEC 75MG  has been approved with [employer].    PA# [id number] Effective dates: 5.15.24 through 5.15.25  Per [employer] test claim, copay for 30 days supply is $0 w e/Voucher

## 2023-04-04 NOTE — Telephone Encounter (Signed)
Patient Advocate Encounter   Received notification that prior authorization for Nurtec 75MG  dispersible tablets is required.   PA submitted on 04/04/2023 [employer] Electronic PA Form Status is pending

## 2023-06-07 ENCOUNTER — Telehealth: Payer: Self-pay | Admitting: Psychiatry

## 2023-06-07 ENCOUNTER — Encounter: Payer: Self-pay | Admitting: Psychiatry

## 2023-06-07 NOTE — Telephone Encounter (Signed)
LVM and sent letter in mail informing pt of need to reschedule 08/27/23 appt - MD out  If pt calls back to reschedule, ok to schedule with NP or can schedule with another provider who sees for symptoms pt was diagnosed with

## 2023-08-27 ENCOUNTER — Ambulatory Visit: Payer: 59 | Admitting: Psychiatry

## 2024-01-31 IMAGING — CT CT ANGIO NECK
2 of 11 series · 4 of 33 positions shown · non-contrast
Comparison: MRI 04/15/2022

CLINICAL DATA: Stroke. Follow-up. Left facial numbness. Blurred
vision. Slurred speech.

EXAM:
CT ANGIOGRAPHY HEAD AND NECK
TECHNIQUE: Multidetector CT imaging of the head and neck was performed using
the standard protocol during bolus administration of intravenous
contrast. Multiplanar CT image reconstructions and MIPs were
obtained to evaluate the vascular anatomy. Carotid stenosis
measurements (when applicable) are obtained utilizing NASCET
criteria, using the distal internal carotid diameter as the
denominator.

[Series 12: brain 3.00 hr40 s3 sag without ibhc · sagittal · non-contrast · 0.31mm/px · 1 of 64 slices shown]
[im 32/64  soft-tissue]
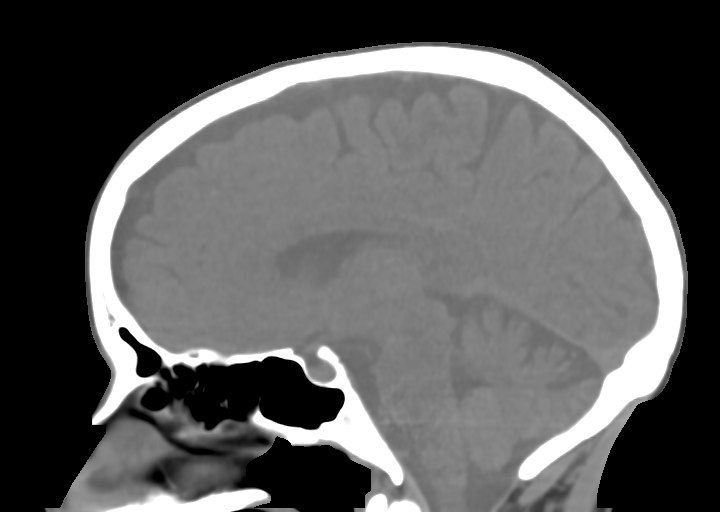

[Series 15: cta head & neck 1.00 hv48 s3 ax thin mips · axial · 0.44mm/px · z∈[-906,-529]mm · 3 of 378 slices shown]
[im 1/378  soft-tissue]
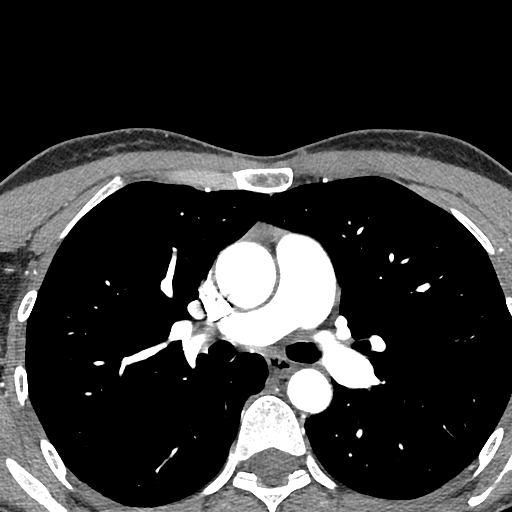
[im 189/378  bone]
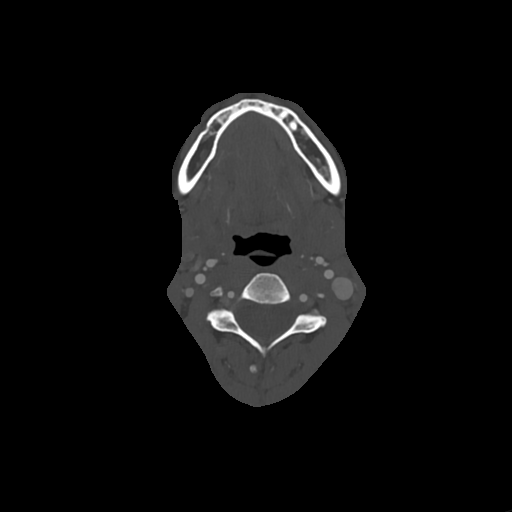
[im 378/378  soft-tissue]
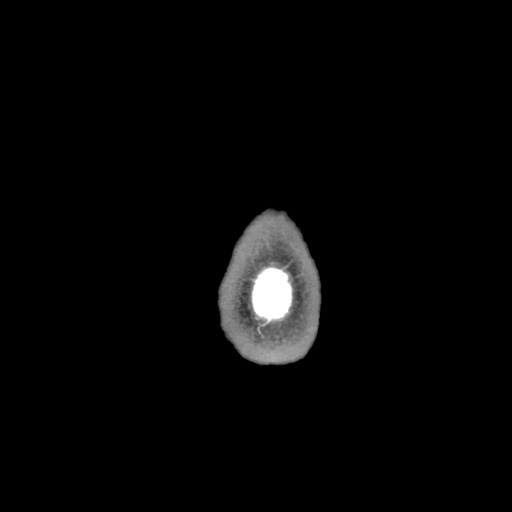

[4 of 33 positions shown; findings below may reference images not displayed]

RADIATION DOSE REDUCTION: This exam was performed according to the
departmental dose-optimization program which includes automated
exposure control, adjustment of the mA and/or kV according to
patient size and/or use of iterative reconstruction technique.

CONTRAST:  75mL WKDX6W-0TO IOPAMIDOL (WKDX6W-0TO) INJECTION 76%
FINDINGS: CT HEAD FINDINGS

Brain: The brain shows a normal appearance without evidence of
malformation, atrophy, old or acute small or large vessel
infarction, mass lesion, hemorrhage, hydrocephalus or extra-axial
collection.

Vascular: No hyperdense vessel. No evidence of atherosclerotic
calcification.

Skull: Normal.  No traumatic finding.  No focal bone lesion.

Sinuses/Orbits: Sinuses are clear. Orbits appear normal. Mastoids
are clear.

Other: None significant

CTA NECK FINDINGS

Aortic arch: Normal

Right carotid system: Common carotid artery widely patent to the
bifurcation. Carotid bifurcation is normal. Cervical ICA is normal.

Left carotid system: Left carotid system similarly normal.

Vertebral arteries: Both vertebral artery origins are widely patent.
Both vertebral arteries are normal through the cervical region to
the foramen magnum.

Skeleton: Normal appearance of the spine.

Other neck: No mass or lymphadenopathy.

Upper chest: Normal

Review of the MIP images confirms the above findings

CTA HEAD FINDINGS

Anterior circulation: Both internal carotid arteries are patent
through the skull base and siphon regions. The anterior and middle
cerebral vessels are normal. No large vessel occlusion, proximal
stenosis, aneurysm or vascular malformation.

Posterior circulation: Both vertebral arteries are widely patent to
the basilar. No basilar stenosis. Posterior circulation branch
vessels are normal.

Venous sinuses: Patent and normal.

Anatomic variants: None significant.

Review of the MIP images confirms the above findings
IMPRESSION: Normal head CT.

Normal CT angiography of the neck and head vasculature.

## 2024-01-31 IMAGING — CT CT ANGIO HEAD
2 of 11 series · 4 of 33 positions shown · non-contrast
Comparison: MRI 04/15/2022

CLINICAL DATA: Stroke. Follow-up. Left facial numbness. Blurred
vision. Slurred speech.

EXAM:
CT ANGIOGRAPHY HEAD AND NECK
TECHNIQUE: Multidetector CT imaging of the head and neck was performed using
the standard protocol during bolus administration of intravenous
contrast. Multiplanar CT image reconstructions and MIPs were
obtained to evaluate the vascular anatomy. Carotid stenosis
measurements (when applicable) are obtained utilizing NASCET
criteria, using the distal internal carotid diameter as the
denominator.

[Series 12: brain 3.00 hr40 s3 sag without ibhc · sagittal · non-contrast · 0.31mm/px · 1 of 64 slices shown]
[im 32/64  soft-tissue]
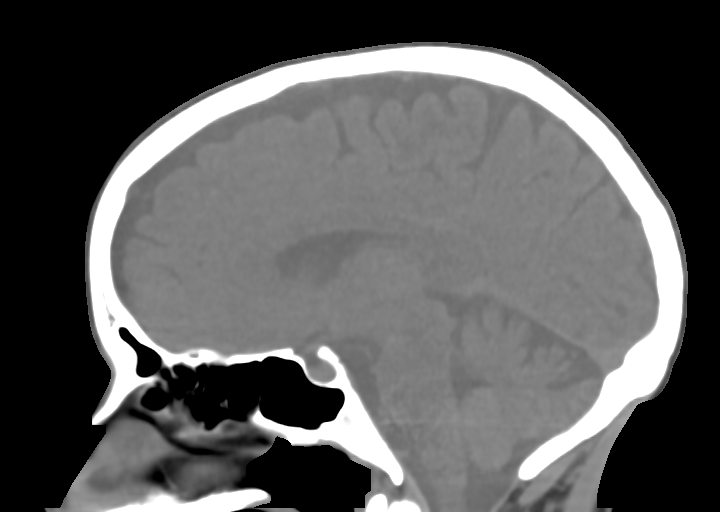

[Series 15: cta head & neck 1.00 hv48 s3 ax thin mips · axial · 0.44mm/px · z∈[-906,-529]mm · 3 of 378 slices shown]
[im 1/378  soft-tissue]
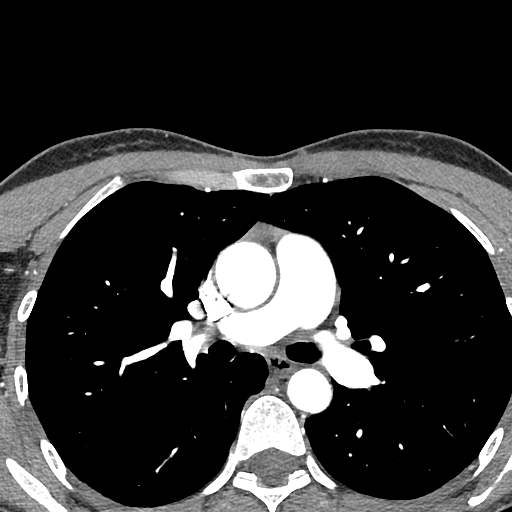
[im 189/378  bone]
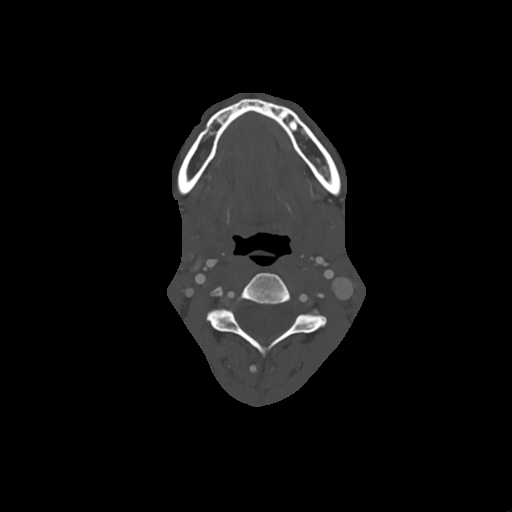
[im 378/378  soft-tissue]
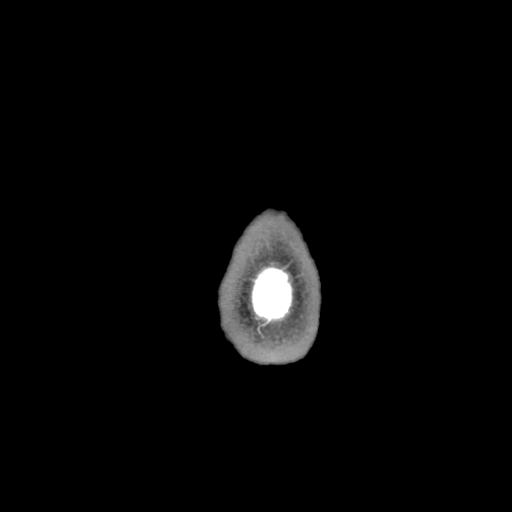

[4 of 33 positions shown; findings below may reference images not displayed]

RADIATION DOSE REDUCTION: This exam was performed according to the
departmental dose-optimization program which includes automated
exposure control, adjustment of the mA and/or kV according to
patient size and/or use of iterative reconstruction technique.

CONTRAST:  75mL WKDX6W-0TO IOPAMIDOL (WKDX6W-0TO) INJECTION 76%
FINDINGS: CT HEAD FINDINGS

Brain: The brain shows a normal appearance without evidence of
malformation, atrophy, old or acute small or large vessel
infarction, mass lesion, hemorrhage, hydrocephalus or extra-axial
collection.

Vascular: No hyperdense vessel. No evidence of atherosclerotic
calcification.

Skull: Normal.  No traumatic finding.  No focal bone lesion.

Sinuses/Orbits: Sinuses are clear. Orbits appear normal. Mastoids
are clear.

Other: None significant

CTA NECK FINDINGS

Aortic arch: Normal

Right carotid system: Common carotid artery widely patent to the
bifurcation. Carotid bifurcation is normal. Cervical ICA is normal.

Left carotid system: Left carotid system similarly normal.

Vertebral arteries: Both vertebral artery origins are widely patent.
Both vertebral arteries are normal through the cervical region to
the foramen magnum.

Skeleton: Normal appearance of the spine.

Other neck: No mass or lymphadenopathy.

Upper chest: Normal

Review of the MIP images confirms the above findings

CTA HEAD FINDINGS

Anterior circulation: Both internal carotid arteries are patent
through the skull base and siphon regions. The anterior and middle
cerebral vessels are normal. No large vessel occlusion, proximal
stenosis, aneurysm or vascular malformation.

Posterior circulation: Both vertebral arteries are widely patent to
the basilar. No basilar stenosis. Posterior circulation branch
vessels are normal.

Venous sinuses: Patent and normal.

Anatomic variants: None significant.

Review of the MIP images confirms the above findings
IMPRESSION: Normal head CT.

Normal CT angiography of the neck and head vasculature.

## 2024-03-04 ENCOUNTER — Telehealth: Payer: Self-pay

## 2024-03-04 ENCOUNTER — Other Ambulatory Visit (HOSPITAL_COMMUNITY): Payer: Self-pay

## 2024-03-04 NOTE — Telephone Encounter (Signed)
 Please disregard, has not been seen since 2023

## 2024-03-04 NOTE — Telephone Encounter (Signed)
 Pharmacy Patient Advocate Encounter   Received notification from [employer] that prior authorization for Nurtec is due for renewal.   Insurance verification completed.   The patient is insured through [facility].  Action: Patient hasn't been seen in your office in over a year. Plan requires updated chart notes for PA renewal.

## 2024-04-11 ENCOUNTER — Other Ambulatory Visit: Payer: Self-pay | Admitting: Gynecology

## 2024-04-11 DIAGNOSIS — R921 Mammographic calcification found on diagnostic imaging of breast: Secondary | ICD-10-CM

## 2024-04-11 DIAGNOSIS — R928 Other abnormal and inconclusive findings on diagnostic imaging of breast: Secondary | ICD-10-CM

## 2024-04-22 ENCOUNTER — Ambulatory Visit
Admission: RE | Admit: 2024-04-22 | Discharge: 2024-04-22 | Disposition: A | Discharge status: Home / Self Care | Source: Ambulatory Visit | Attending: Gynecology | Admitting: Gynecology

## 2024-04-22 ENCOUNTER — Other Ambulatory Visit: Payer: Self-pay | Admitting: Gynecology

## 2024-04-22 DIAGNOSIS — R928 Other abnormal and inconclusive findings on diagnostic imaging of breast: Secondary | ICD-10-CM

## 2024-04-22 DIAGNOSIS — R921 Mammographic calcification found on diagnostic imaging of breast: Secondary | ICD-10-CM

## 2024-04-23 ENCOUNTER — Ambulatory Visit
Admission: RE | Admit: 2024-04-23 | Discharge: 2024-04-23 | Disposition: A | Discharge status: Home / Self Care | Source: Ambulatory Visit | Attending: Gynecology | Admitting: Gynecology

## 2024-04-23 DIAGNOSIS — R921 Mammographic calcification found on diagnostic imaging of breast: Secondary | ICD-10-CM

## 2024-04-23 HISTORY — PX: BREAST BIOPSY: SHX20

## 2024-04-24 LAB — SURGICAL PATHOLOGY
